# Patient Record
Sex: Male | Born: 1990 | Race: White | Hispanic: No | Marital: Single | Smoking: Never smoker
Health system: Southern US, Community
[De-identification: ages and names within clinical notes are randomized; demographics above are authoritative.]

---

## 2017-02-04 ENCOUNTER — Encounter (HOSPITAL_COMMUNITY): Payer: Self-pay | Admitting: Emergency Medicine

## 2017-02-04 ENCOUNTER — Other Ambulatory Visit: Payer: Self-pay | Admitting: Radiology

## 2017-02-04 ENCOUNTER — Emergency Department (HOSPITAL_COMMUNITY)
Admission: EM | Admit: 2017-02-04 | Discharge: 2017-02-04 | Disposition: A | Discharge status: Home / Self Care | Payer: BLUE CROSS/BLUE SHIELD | Attending: Emergency Medicine | Admitting: Emergency Medicine

## 2017-02-04 ENCOUNTER — Emergency Department (HOSPITAL_COMMUNITY): Payer: BLUE CROSS/BLUE SHIELD

## 2017-02-04 DIAGNOSIS — R112 Nausea with vomiting, unspecified: Secondary | ICD-10-CM | POA: Diagnosis not present

## 2017-02-04 DIAGNOSIS — J3489 Other specified disorders of nose and nasal sinuses: Secondary | ICD-10-CM | POA: Insufficient documentation

## 2017-02-04 DIAGNOSIS — H538 Other visual disturbances: Secondary | ICD-10-CM | POA: Diagnosis not present

## 2017-02-04 DIAGNOSIS — M542 Cervicalgia: Secondary | ICD-10-CM | POA: Diagnosis not present

## 2017-02-04 DIAGNOSIS — R5383 Other fatigue: Secondary | ICD-10-CM | POA: Insufficient documentation

## 2017-02-04 DIAGNOSIS — R42 Dizziness and giddiness: Secondary | ICD-10-CM | POA: Insufficient documentation

## 2017-02-04 DIAGNOSIS — R51 Headache: Secondary | ICD-10-CM | POA: Diagnosis present

## 2017-02-04 DIAGNOSIS — R519 Headache, unspecified: Secondary | ICD-10-CM

## 2017-02-04 MED ORDER — DEXAMETHASONE SODIUM PHOSPHATE 10 MG/ML IJ SOLN
10.0000 mg | Freq: Once | INTRAMUSCULAR | Status: AC
  Administered 2017-02-04: 10 mg via INTRAVENOUS
  Filled 2017-02-04: qty 1

## 2017-02-04 MED ORDER — KETOROLAC TROMETHAMINE 30 MG/ML IJ SOLN
30.0000 mg | Freq: Once | INTRAMUSCULAR | Status: AC
  Administered 2017-02-04: 30 mg via INTRAVENOUS
  Filled 2017-02-04: qty 1

## 2017-02-04 MED ORDER — SODIUM CHLORIDE 0.9 % IV BOLUS (SEPSIS)
1000.0000 mL | Freq: Once | INTRAVENOUS | Status: AC
  Administered 2017-02-04: 1000 mL via INTRAVENOUS

## 2017-02-04 MED ORDER — PROCHLORPERAZINE EDISYLATE 5 MG/ML IJ SOLN
10.0000 mg | Freq: Once | INTRAMUSCULAR | Status: AC
  Administered 2017-02-04: 10 mg via INTRAVENOUS
  Filled 2017-02-04: qty 2

## 2017-02-04 MED ORDER — DIPHENHYDRAMINE HCL 50 MG/ML IJ SOLN
50.0000 mg | Freq: Once | INTRAMUSCULAR | Status: AC
Start: 2017-02-04 — End: 2017-02-04
  Administered 2017-02-04: 50 mg via INTRAVENOUS
  Filled 2017-02-04: qty 1

## 2017-02-04 NOTE — ED Provider Notes (Signed)
Received signout from PA [facility]. Refer to provider note for full H&P. Briefly, patient is a 26 year old male chronic headache for one month with associated neck stiffness and numbness in his right arm positionally. He was diagnosed with possible migraines and was given medication by his primary care physician. He does have a history of factor V Leiden and his family, and there is concern for a possible cavernous sinus thrombosis. Awaiting MRV results for evaluation of cavernous sinus thrombosis.    Physical Exam  BP 127/87   Pulse 63   Temp 98 F (36.7 C) (Oral)   Resp 18   SpO2 99%   Physical Exam  Constitutional: He is oriented to person, place, and time. He appears well-developed and well-nourished. No distress.  Resting comfortably in bed  HENT:  Head: Normocephalic and atraumatic.  Right Ear: External ear normal.  Left Ear: External ear normal.  Mouth/Throat: Oropharynx is clear and moist.  Eyes: Pupils are equal, round, and reactive to light. Conjunctivae are normal. Right eye exhibits no discharge. Left eye exhibits no discharge.  Neck: No JVD present. No tracheal deviation present.  Cardiovascular: Normal rate, regular rhythm, normal heart sounds and intact distal pulses.   Pulmonary/Chest: Effort normal and breath sounds normal. No respiratory distress. He has no wheezes. He has no rales. He exhibits no tenderness.  Abdominal: Soft. Bowel sounds are normal. He exhibits no distension.  Musculoskeletal: He exhibits no edema.  Neurological: He is alert and oriented to person, place, and time. No cranial nerve deficit.  Fluent speech, no facial droop  Skin: Skin is warm and dry. No erythema.  Psychiatric: He has a normal mood and affect. His behavior is normal.  Nursing note and vitals reviewed.   ED Course  Procedures  MDM Patient had normal MR venogram with no evidence of dural sinus thrombosis. He is still complaining of headache, will attempt to control his pain with  migraine cocktail.  9:43 AM On reevaluation, patient states his headache has improved significantly. He is stable for discharge home with close follow-up with primary care physician for reevaluation and possible referral to neurology. Discussed indications for return to the ED. Patient and patient's mother verbalized understanding of and agreement with plan and patient is stable for discharge home at this time.     Jeanie SewerFawze, Salar Molden A, PA-C 02/04/17 [phone]

## 2017-02-04 NOTE — ED Triage Notes (Addendum)
Pt BIB mother POV, pt states severe stiff neck with HA, n/v began about one month ago. Pt is worse in mornings and evenings.  Pt started on Imitrex last Wednesday by PCP, pt states last night pain was significantly worse with numbness in R arm. That has never occurred before.  Emesis x 8 in last 2 hours. Pt denies photo or phonophobia Pt has had opthalmology check up recently which was unremarkable. Pt is A & O, no neuro deficits noted, pt was advised by PCP if meds did not work next step would be CT scan.   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 Pt is from [place] area.

## 2017-02-04 NOTE — ED Provider Notes (Signed)
[facility]-EMERGENCY DEPT Provider Note   CSN: [id number] Arrival date & time: 02/04/17  0347     History   Chief Complaint Chief Complaint  Patient presents with  . Torticollis    HPI Lucina MellowRobert Babinski is a 26 y.o. male with no past medical history presenting with chronic headache over the past month, neck stiffness and pain, right arm numbness once last night which has now resolved. After the onset of his symptoms he thought maybe his vision was changing causing his headaches and  had an eye exam which was completely normal. He is describing a pressure sensation behind his eyes which is aggravated by leaning forward. He is currently visiting from [place] and explains that he has been seen for headaches and prescribed migraine medication which have not helped his symptoms. He states that over the last couple nights symptoms have worsen and he has been experiencing nausea, vomiting and the one episode of numbness in his right arm for the first time. Symptoms are aggravated by lying flat and relieved by standing up. He denies any fever, chills, congestion or other symptoms. No family history of MS but sister with factor V Leiden.  HPI  History reviewed. No pertinent past medical history.  There are no active problems to display for this patient.   History reviewed. No pertinent surgical history.     Home Medications    Prior to Admission medications   Not on File    Family History No family history on file.  Social History Social History  Substance Use Topics  . Smoking status: Never Smoker  . Smokeless tobacco: Never Used  . Alcohol use Yes     Comment: occ     Allergies   Patient has no known allergies.   Review of Systems Review of Systems  Constitutional: Positive for fatigue. Negative for chills and fever.  HENT: Positive for sinus pain. Negative for congestion, ear pain, sinus pressure, sore throat, trouble swallowing and voice change.   Eyes: Positive for visual  disturbance. Negative for photophobia, pain and redness.  Respiratory: Negative for cough and shortness of breath.   Cardiovascular: Negative for chest pain and palpitations.  Gastrointestinal: Positive for nausea and vomiting. Negative for abdominal pain.  Genitourinary: Negative for difficulty urinating, dysuria and hematuria.  Musculoskeletal: Positive for myalgias, neck pain and neck stiffness. Negative for arthralgias, back pain, gait problem and joint swelling.  Skin: Negative for color change, pallor and rash.  Neurological: Positive for light-headedness, numbness and headaches. Negative for dizziness, tremors, seizures, syncope, facial asymmetry, speech difficulty and weakness.     Physical Exam Updated Vital Signs BP 127/87   Pulse 63   Temp 98 F (36.7 C) (Oral)   Resp 18   SpO2 99%   Physical Exam  Constitutional: He is oriented to person, place, and time. He appears well-developed and well-nourished. No distress.  Afebrile, nontoxic-appearing, sitting comfortably in bed in no acute distress.  HENT:  Head: Normocephalic and atraumatic.  Mouth/Throat: Oropharynx is clear and moist. No oropharyngeal exudate.  Tenderness palpation of the frontal sinus  Eyes: Pupils are equal, round, and reactive to light. Conjunctivae and EOM are normal. Right eye exhibits no discharge. Left eye exhibits no discharge.  Patient reports worsening pain with lateral extraocular motion. No nystagmus. No periorbital swelling  Neck: Normal range of motion. Neck supple.  No meningeal signs  Cardiovascular: Normal rate, regular rhythm, normal heart sounds and intact distal pulses.   No murmur heard. Pulmonary/Chest: Effort normal and  breath sounds normal. No respiratory distress. He has no wheezes. He has no rales.  Abdominal: He exhibits no distension.  Musculoskeletal: Normal range of motion. He exhibits no edema, tenderness or deformity.  No midline tenderness palpation of the spine    Neurological: He is alert and oriented to person, place, and time. No cranial nerve deficit or sensory deficit. He exhibits normal muscle tone. Coordination normal.  Neurologic Exam:  - Mental status: Patient is alert and cooperative. Fluent speech and words are clear. Coherent thought processes and insight is good. Patient is oriented x 4 to person, place, time and event.  - Cranial nerves:  CN III, IV, VI: pupils equally round, reactive to light both direct and conscensual. Full extra-ocular movement. CN V: motor temporalis and masseter strength intact. CN VII : muscles of facial expression intact. CN X :  midline uvula. XI strength of sternocleidomastoid and trapezius muscles 5/5, XII: tongue is midline when protruded. - Motor: No involuntary movements. Muscle tone and bulk normal throughout. Muscle strength is 5/5 in bilateral shoulder abduction, elbow flexion and extension, grip, hip extension, flexion, leg flexion and extension, ankle dorsiflexion and plantar flexion.  - Sensory: Proprioception, light tough sensation intact in all extremities.  - Cerebellar: rapid alternating movements and point to point movement intact in upper and lower extremities. Normal stance and gait.  Skin: Skin is warm and dry. No rash noted. He is not diaphoretic. No erythema. No pallor.  Psychiatric: He has a normal mood and affect.  Nursing note and vitals reviewed.    ED Treatments / Results  Labs (all labs ordered are listed, but only abnormal results are displayed) Labs Reviewed - No data to display  EKG  EKG Interpretation None       Radiology No results found.  Procedures Procedures (including critical care time)  Medications Ordered in ED Medications - No data to display   Initial Impression / Assessment and Plan / ED Course  I have reviewed the triage vital signs and the nursing notes.  Pertinent labs & imaging results that were available during my care of the patient were reviewed by  me and considered in my medical decision making (see chart for details).    Patient presenting with progressively worsening headache, neck pain, sensation of pressure behind his eyes, nausea vomiting with acute worsening over the last 2 days. Reassuring exam, afebrile, nontoxic, normal neuro exam. Negative meningeal signs.  Has no known past medical history and started a new medication for migraine for same symptoms a couple weeks ago without relief.  No family history of MS, family history of factor V leiden with sister.  Ordered MRI to evaluate for cerebral sinus thrombosis or MS. Patient was discussed with Dr. Eudelia Bunch who has also seen patient and agrees with assessment and plan.  Patient care was transferred at end of shift to [facility], PA-C pending MRV results.  Dispo dependent on MRV findings.   Final Clinical Impressions(s) / ED Diagnoses   Final diagnoses:  None    New Prescriptions New Prescriptions   No medications on file     Gregary Cromer 02/04/17 1610    Nira Conn, MD 02/05/17 1039

## 2017-02-04 NOTE — ED Notes (Signed)
Patient transported to MRI 

## 2017-02-04 NOTE — Discharge Instructions (Signed)
Continue taking her usual medications as prescribed. Follow-up with your primary care physician for reevaluation and possible referral to neurology. Return to the ED immediately if any concerning signs or symptoms develop such as fevers, slurred speech, facial droop, weakness, passing out, vomiting, severe headache, or difficulty walking.

## 2018-08-29 IMAGING — MR MR MRV HEAD W/O CM
1 series · 20 of 48 positions shown · non-contrast
Comparison: None

CLINICAL DATA: Chronic headaches over the last 1 month. Subjective
visual changes. Episode of upper extremity numbness. Dural venous
sinus thrombosis suspected. Sister with factor 5 Shibad deficiency.

EXAM:
MR VENOGRAM the HEAD WITHOUT CONTRAST
TECHNIQUE: Angiographic images of the intracranial venous structures were
obtained using MRV technique without intravenous contrast.

[Series 2: MRV · coronal · 1.5mm · 0.45mm/px · 20 of 132 slices shown]
[im 1/132]
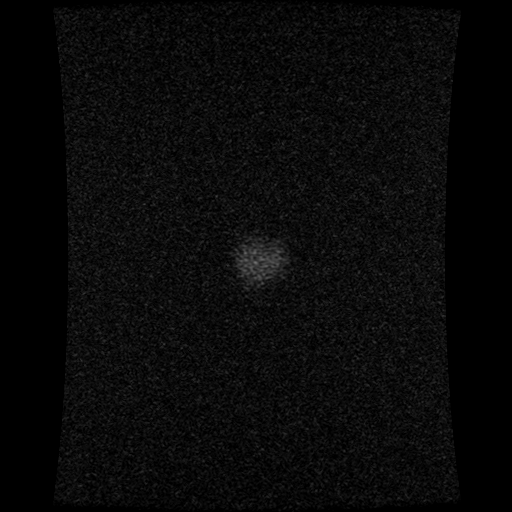
[im 3/132]
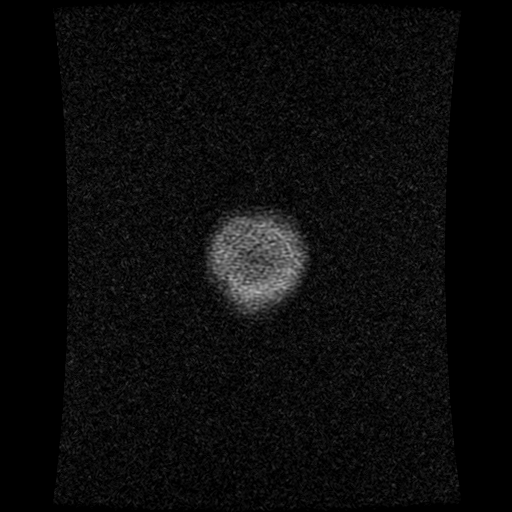
[im 6/132]
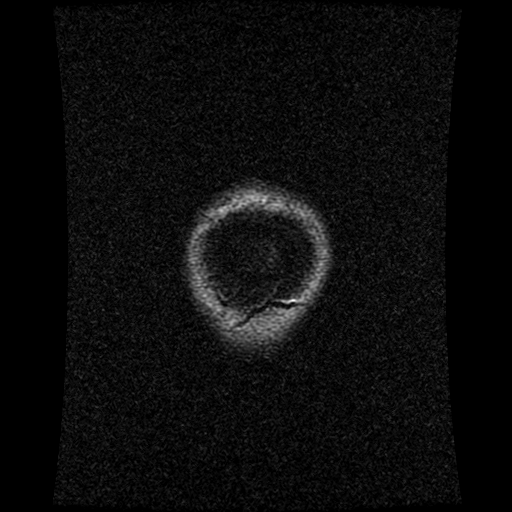
[im 9/132]
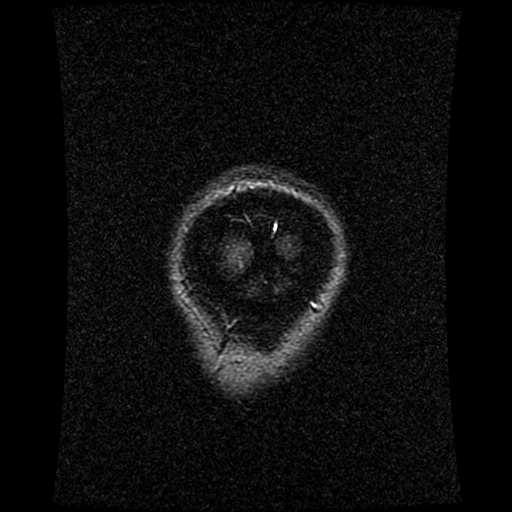
[im 12/132]
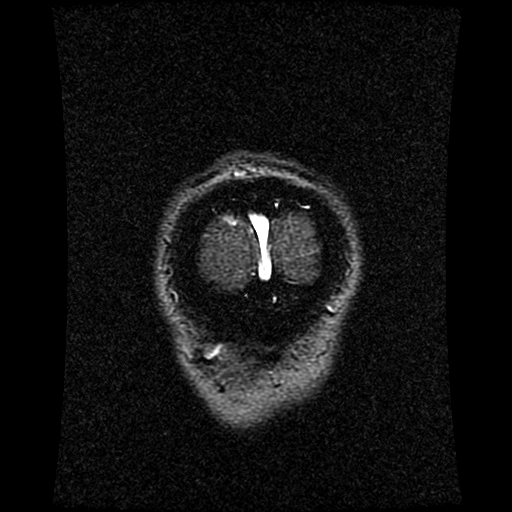
[im 14/132]
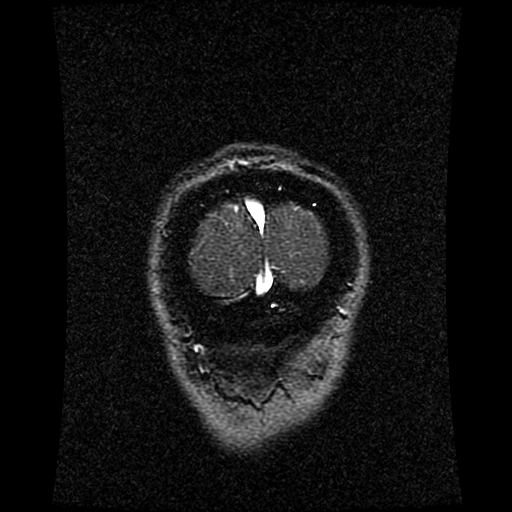
[im 17/132]
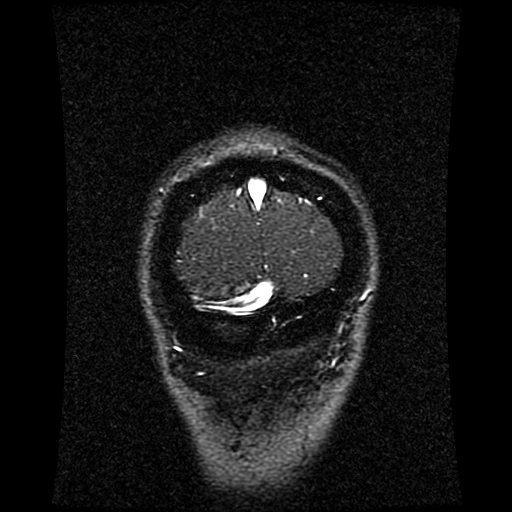
[im 20/132]
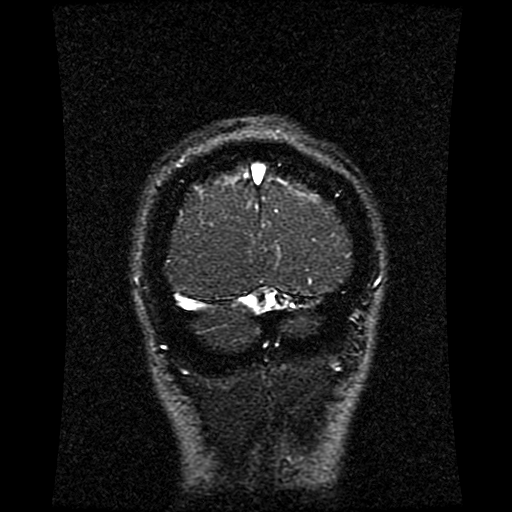
[im 23/132]
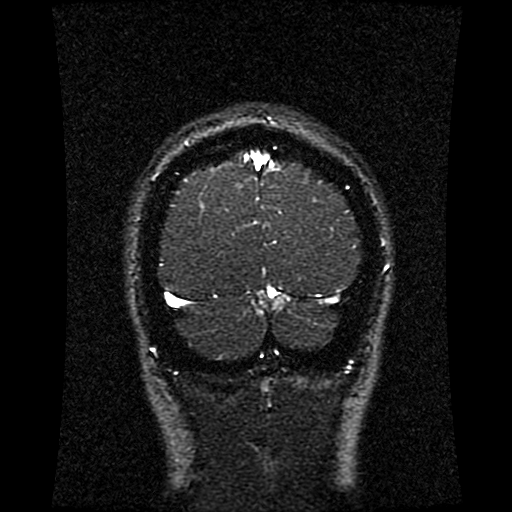
[im 26/132]
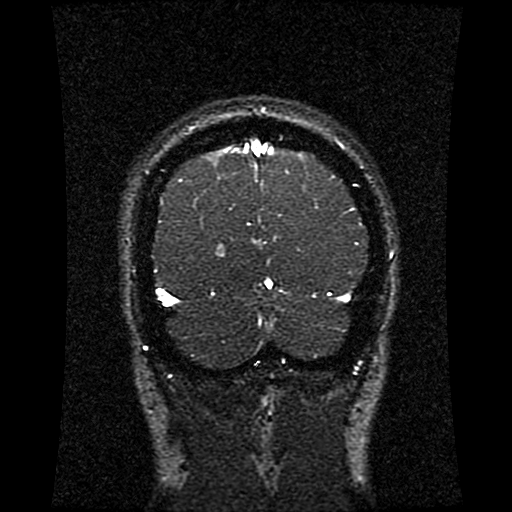
[im 28/132]
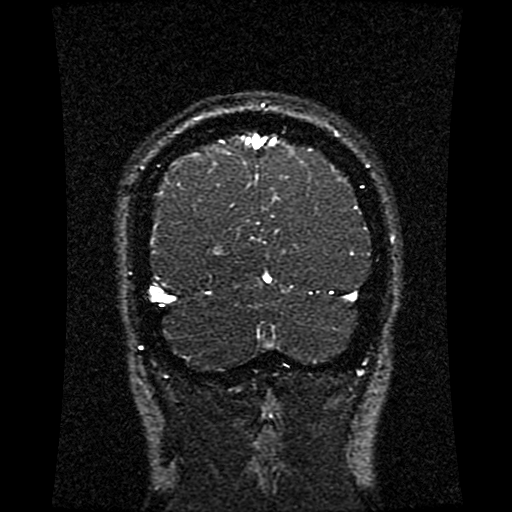
[im 31/132]
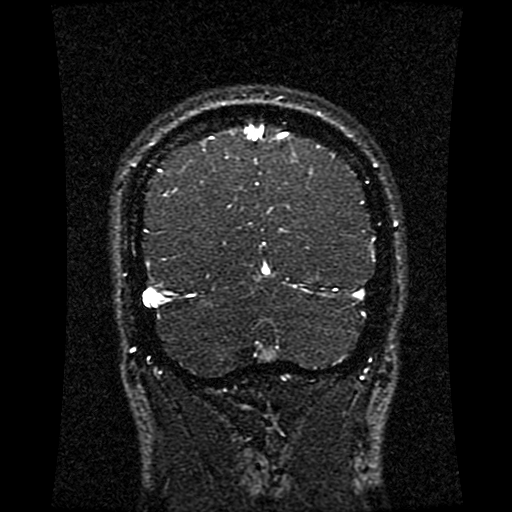
[im 42/132]
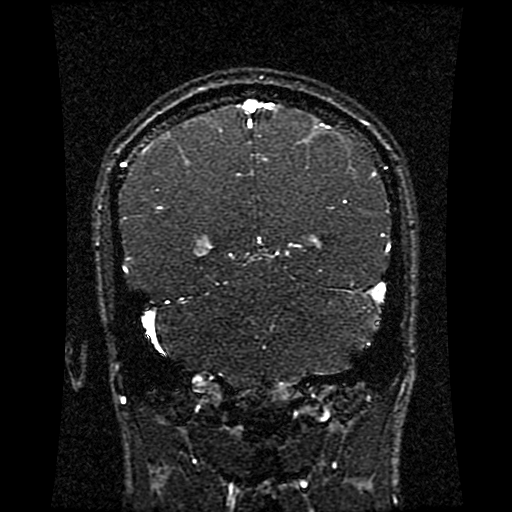
[im 59/132]
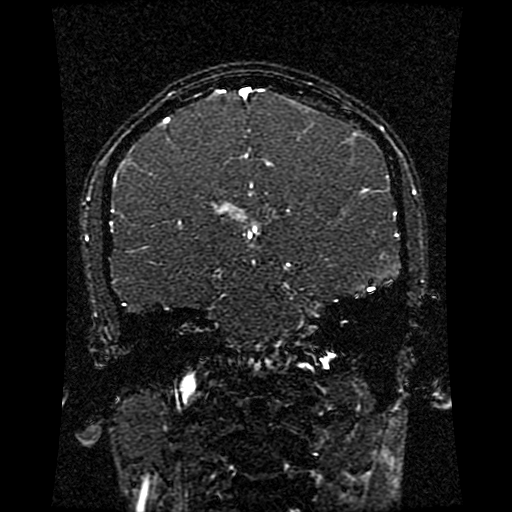
[im 67/132]
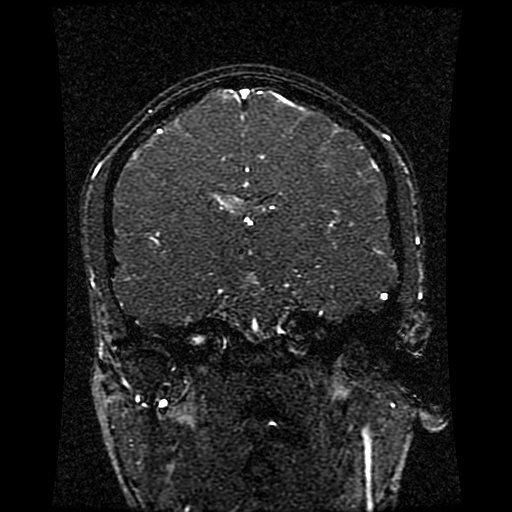
[im 76/132]
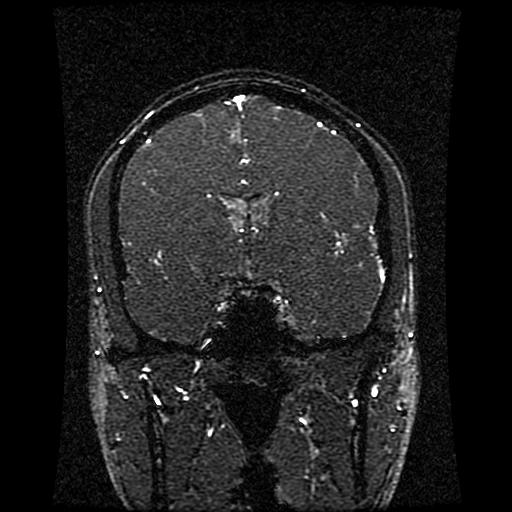
[im 92/132]
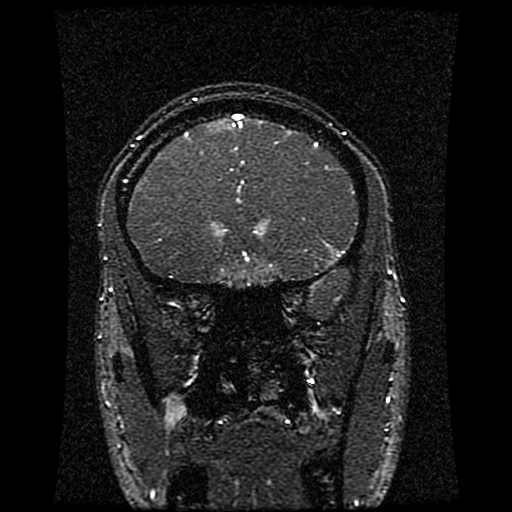
[im 109/132]
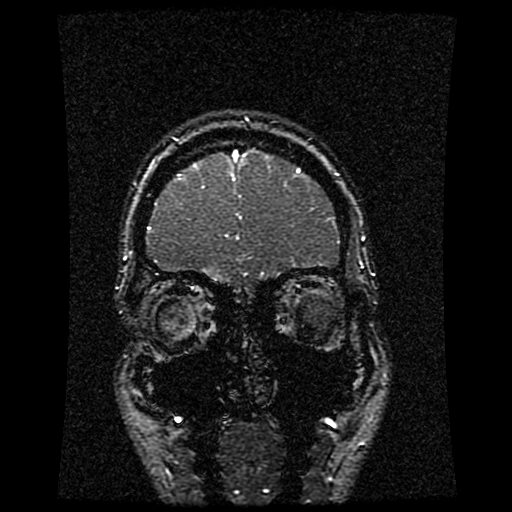
[im 112/132]
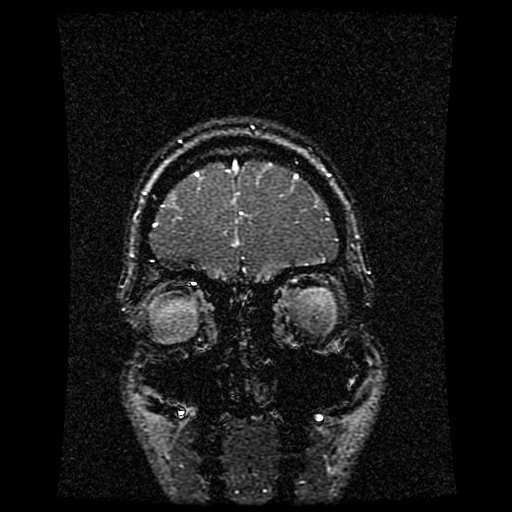
[im 126/132]
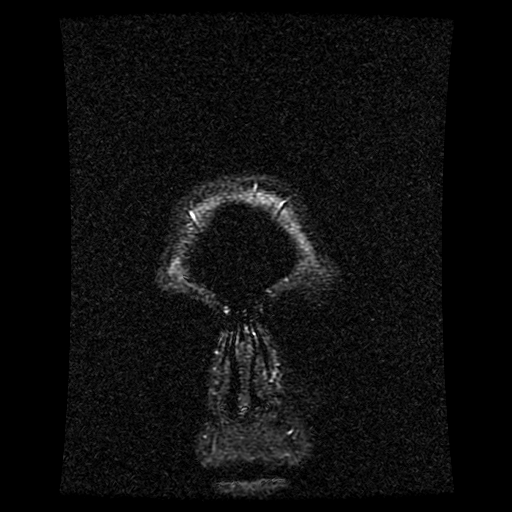

[20 of 48 positions shown; findings below may reference images not displayed]

FINDINGS: The superior sagittal sinus is patent. The right transverse sinus is
dominant. Flow is present in the left transverse sinus and bilateral
sigmoid sinus to the jugular vein. The straight sinus and deep
cerebral veins are intact. Cortical veins are unremarkable.
IMPRESSION: Normal MR venogram.  No evidence for dural sinus thrombosis.
